# Patient Record
Sex: Male | Born: 1965 | Race: Black or African American | Hispanic: No | Marital: Single | Smoking: Never smoker
Health system: Southern US, Community
[De-identification: ages and names within clinical notes are randomized; demographics above are authoritative.]

## PROBLEM LIST (undated history)

## (undated) DIAGNOSIS — I10 Essential (primary) hypertension: Secondary | ICD-10-CM

---

## 2005-04-10 ENCOUNTER — Emergency Department: Payer: Self-pay | Admitting: Emergency Medicine

## 2005-04-10 ENCOUNTER — Other Ambulatory Visit: Payer: Self-pay

## 2009-06-07 ENCOUNTER — Ambulatory Visit: Payer: Self-pay | Admitting: Pain Medicine

## 2009-06-13 ENCOUNTER — Ambulatory Visit: Payer: Self-pay | Admitting: Pain Medicine

## 2018-11-14 ENCOUNTER — Other Ambulatory Visit: Payer: Self-pay | Admitting: Family Medicine

## 2018-11-15 LAB — HGB A1C W/O EAG: Hgb A1c MFr Bld: 6 % — ABNORMAL HIGH (ref 4.8–5.6)

## 2019-06-22 ENCOUNTER — Other Ambulatory Visit: Payer: Self-pay

## 2019-06-22 ENCOUNTER — Emergency Department: Payer: Self-pay

## 2019-06-22 ENCOUNTER — Emergency Department
Admission: EM | Admit: 2019-06-22 | Discharge: 2019-06-23 | Disposition: A | Discharge status: Home / Self Care | Payer: Self-pay | Attending: Emergency Medicine | Admitting: Emergency Medicine

## 2019-06-22 DIAGNOSIS — R202 Paresthesia of skin: Secondary | ICD-10-CM | POA: Insufficient documentation

## 2019-06-22 DIAGNOSIS — R030 Elevated blood-pressure reading, without diagnosis of hypertension: Secondary | ICD-10-CM | POA: Insufficient documentation

## 2019-06-22 LAB — APTT: aPTT: 33 seconds (ref 24–36)

## 2019-06-22 LAB — COMPREHENSIVE METABOLIC PANEL
ALT: 26 U/L (ref 0–44)
AST: 23 U/L (ref 15–41)
Albumin: 4.6 g/dL (ref 3.5–5.0)
Alkaline Phosphatase: 57 U/L (ref 38–126)
Anion gap: 9 (ref 5–15)
BUN: 19 mg/dL (ref 6–20)
CO2: 24 mmol/L (ref 22–32)
Calcium: 9.8 mg/dL (ref 8.9–10.3)
Chloride: 107 mmol/L (ref 98–111)
Creatinine, Ser: 1.13 mg/dL (ref 0.61–1.24)
GFR calc Af Amer: >60 mL/min (ref >60)
GFR calc non Af Amer: >60 mL/min (ref >60)
Glucose, Bld: 111 mg/dL — ABNORMAL HIGH (ref 70–99)
Potassium: 4.2 mmol/L (ref 3.5–5.1)
Sodium: 140 mmol/L (ref 135–145)
Total Bilirubin: 0.8 mg/dL (ref 0.3–1.2)
Total Protein: 8.2 g/dL — ABNORMAL HIGH (ref 6.5–8.1)

## 2019-06-22 LAB — CBC
HCT: 46.1 % (ref 39.0–52.0)
Hemoglobin: 16.1 g/dL (ref 13.0–17.0)
MCH: 29.8 pg (ref 26.0–34.0)
MCHC: 34.9 g/dL (ref 30.0–36.0)
MCV: 85.4 fL (ref 80.0–100.0)
Platelets: 257 10*3/uL (ref 150–400)
RBC: 5.4 MIL/uL (ref 4.22–5.81)
RDW: 12.6 % (ref 11.5–15.5)
WBC: 6.7 10*3/uL (ref 4.0–10.5)
nRBC: 0 % (ref 0.0–0.2)

## 2019-06-22 LAB — DIFFERENTIAL
Abs Immature Granulocytes: 0.03 10*3/uL (ref 0.00–0.07)
Basophils Absolute: 0.1 10*3/uL (ref 0.0–0.1)
Basophils Relative: 1 %
Eosinophils Absolute: 0.3 10*3/uL (ref 0.0–0.5)
Eosinophils Relative: 4 %
Immature Granulocytes: 0 %
Lymphocytes Relative: 33 %
Lymphs Abs: 2.2 10*3/uL (ref 0.7–4.0)
Monocytes Absolute: 0.6 10*3/uL (ref 0.1–1.0)
Monocytes Relative: 9 %
Neutro Abs: 3.6 10*3/uL (ref 1.7–7.7)
Neutrophils Relative %: 53 %

## 2019-06-22 LAB — PROTIME-INR
INR: 1 (ref 0.8–1.2)
Prothrombin Time: 13.1 seconds (ref 11.4–15.2)

## 2019-06-22 MED ORDER — SODIUM CHLORIDE 0.9% FLUSH: 3.0000 mL | Freq: Once | INTRAVENOUS | Status: DC

## 2019-06-22 NOTE — ED Triage Notes (Addendum)
Pt comes via POV from home with c/o left arm numbness. Pt states this started last night around 10:30 pm and radiated up his arm. Pt states today he is only having some tingling in his left fingers.  Pt denies any SOB, CP, headache or blurry vision. Pt is VAN negative and mini neuro negative.

## 2019-06-22 NOTE — ED Provider Notes (Signed)
[facility] Emergency Department Provider Note   ____________________________________________   First MD Initiated Contact with Patient 06/22/19 2341     (approximate)  I have reviewed the triage vital signs and the nursing notes.   HISTORY  Chief Complaint Numbness    HPI Jeremy Craig is a 53 y.o. male with no significant past medical history who presents to the ED complaining of numbness.  Patient reports onset of numbness in his left hand and arm since about 1030 yesterday evening.  He states that initially started in his fourth and fifth digits, then seem to migrate into his first and second digits with some radiation into his forearm.  He denies any numbness in his more proximal arm and has not had any weakness in his hand, wrist, elbow, or shoulder.  He denies any symptoms in his left lower extremity and has not had any vision changes or speech changes.  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  Symptoms have improved since onset.        History reviewed. No pertinent past medical history.  There are no active problems to display for this patient.   History reviewed. No pertinent surgical history.  Prior to Admission medications   Not on File    Allergies Patient has no allergy information on record.  No family history on file.  Social History Social History   Tobacco Use  . Smoking status: Not on file  Substance Use Topics  . Alcohol use: Not on file  . Drug use: Not on file    Review of Systems  Constitutional: No fever/chills Eyes: No visual changes. ENT: No sore throat. Cardiovascular: Denies chest pain. Respiratory: Denies shortness of breath. Gastrointestinal: No abdominal pain.  No nausea, no vomiting.  No diarrhea.  No constipation. Genitourinary: Negative for dysuria. Musculoskeletal: Negative for back pain. Skin: Negative for rash. Neurological:  Negative for headaches, focal weakness.  Positive for numbness.  ____________________________________________   PHYSICAL EXAM:  VITAL SIGNS: ED Triage Vitals  Enc Vitals Group     BP 06/22/19 1733 (!) 175/102     Pulse Rate 06/22/19 1733 89     Resp 06/22/19 1733 18     Temp 06/22/19 1733 98.2 F (36.8 C)     Temp Source 06/22/19 1733 Oral     SpO2 06/22/19 1733 96 %     Weight --      Height --      Head Circumference --      Peak Flow --      Pain Score 06/22/19 1740 0     Pain Loc --      Pain Edu? --      Excl. in GC? --     Constitutional: Alert and oriented. Eyes: Conjunctivae are normal. Head: Atraumatic. Nose: No congestion/rhinnorhea. Mouth/Throat: Mucous membranes are moist. Neck: Normal ROM Cardiovascular: Normal rate, regular rhythm. Grossly normal heart sounds. Respiratory: Normal respiratory effort.  No retractions. Lungs CTAB. Gastrointestinal: Soft and nontender. No distention. Genitourinary: deferred Musculoskeletal: No lower extremity tenderness nor edema. Neurologic:  Normal speech and language. No gross focal neurologic deficits are appreciated.  5 out of 5 strength in bilateral upper and lower extremities, sensation intact throughout. Skin:  Skin is warm, dry and intact. No rash noted. Psychiatric: Mood and affect are normal. Speech and behavior are normal.  ____________________________________________   LABS (all labs ordered are listed, but only abnormal  results are displayed)  Labs Reviewed  COMPREHENSIVE METABOLIC PANEL - Abnormal; Notable for the following components:      Result Value   Glucose, Bld 111 (*)    Total Protein 8.2 (*)    All other components within normal limits  PROTIME-INR  APTT  CBC  DIFFERENTIAL  I-STAT CREATININE, ED  CBG MONITORING, ED     PROCEDURES  Procedure(s) performed (including Critical Care):  Procedures   ____________________________________________   INITIAL IMPRESSION / ASSESSMENT AND  PLAN / ED COURSE       53 year old male presents to the ED complaining of numbness migrating through the digits of his left hand starting approximately 24 hours prior.  He has no focal neurologic deficits on my exam and states numbness has improved, no weakness noted.  Do not suspect acute stroke given his benign neurologic exam.  Also have low suspicion for TIA given the distribution of his symptoms, peripheral neuropathy seems much more likely.  His labs are unremarkable.  He does have an elevated blood pressure, however there is no evidence of hypertensive emergency.  Will refer patient to primary care for further evaluation of his blood pressure, counseled to return to the ED for new or worsening symptoms.  Patient agrees with plan.      ____________________________________________   FINAL CLINICAL IMPRESSION(S) / ED DIAGNOSES  Final diagnoses:  Paresthesia  Elevated BP without diagnosis of hypertension     ED Discharge Orders    None       Note:  This document was prepared using Dragon voice recognition software and may include unintentional dictation errors.   Blake Divine, MD 06/23/19 [phone]

## 2019-12-15 ENCOUNTER — Emergency Department
Admission: EM | Admit: 2019-12-15 | Discharge: 2019-12-15 | Disposition: A | Discharge status: Home / Self Care | Payer: BC Managed Care – PPO | Attending: Emergency Medicine | Admitting: Emergency Medicine

## 2019-12-15 ENCOUNTER — Encounter: Payer: Self-pay | Admitting: Emergency Medicine

## 2019-12-15 ENCOUNTER — Other Ambulatory Visit: Payer: Self-pay

## 2019-12-15 ENCOUNTER — Emergency Department: Payer: BC Managed Care – PPO

## 2019-12-15 DIAGNOSIS — R410 Disorientation, unspecified: Secondary | ICD-10-CM | POA: Diagnosis not present

## 2019-12-15 DIAGNOSIS — I10 Essential (primary) hypertension: Secondary | ICD-10-CM | POA: Insufficient documentation

## 2019-12-15 DIAGNOSIS — M791 Myalgia, unspecified site: Secondary | ICD-10-CM | POA: Diagnosis present

## 2019-12-15 LAB — URINALYSIS, COMPLETE (UACMP) WITH MICROSCOPIC
Bacteria, UA: NONE SEEN
Bilirubin Urine: NEGATIVE
Glucose, UA: NEGATIVE mg/dL
Hgb urine dipstick: NEGATIVE
Ketones, ur: NEGATIVE mg/dL
Leukocytes,Ua: NEGATIVE
Nitrite: NEGATIVE
Protein, ur: 100 mg/dL — AB
Specific Gravity, Urine: 1.01 (ref 1.005–1.030)
Squamous Epithelial / HPF: NONE SEEN (ref 0–5)
pH: 6 (ref 5.0–8.0)

## 2019-12-15 LAB — BASIC METABOLIC PANEL
Anion gap: 7 (ref 5–15)
BUN: 16 mg/dL (ref 6–20)
CO2: 25 mmol/L (ref 22–32)
Calcium: 9.5 mg/dL (ref 8.9–10.3)
Chloride: 104 mmol/L (ref 98–111)
Creatinine, Ser: 1.13 mg/dL (ref 0.61–1.24)
GFR calc Af Amer: >60 mL/min (ref >60)
GFR calc non Af Amer: >60 mL/min (ref >60)
Glucose, Bld: 119 mg/dL — ABNORMAL HIGH (ref 70–99)
Potassium: 4.5 mmol/L (ref 3.5–5.1)
Sodium: 136 mmol/L (ref 135–145)

## 2019-12-15 LAB — CBC
HCT: 47.3 % (ref 39.0–52.0)
Hemoglobin: 16.4 g/dL (ref 13.0–17.0)
MCH: 29.8 pg (ref 26.0–34.0)
MCHC: 34.7 g/dL (ref 30.0–36.0)
MCV: 86 fL (ref 80.0–100.0)
Platelets: 280 10*3/uL (ref 150–400)
RBC: 5.5 MIL/uL (ref 4.22–5.81)
RDW: 12.6 % (ref 11.5–15.5)
WBC: 5.4 10*3/uL (ref 4.0–10.5)
nRBC: 0 % (ref 0.0–0.2)

## 2019-12-15 LAB — TROPONIN I (HIGH SENSITIVITY): Troponin I (High Sensitivity): 7 ng/L (ref <18)

## 2019-12-15 MED ORDER — LABETALOL HCL 5 MG/ML IV SOLN
20.0000 mg | Freq: Once | INTRAVENOUS | Status: AC
  Administered 2019-12-15: 20 mg via INTRAVENOUS
  Filled 2019-12-15: qty 4

## 2019-12-15 MED ORDER — CARVEDILOL 6.25 MG PO TABS: 6.2500 mg | ORAL_TABLET | Freq: Two times a day (BID) | ORAL | 11 refills | Status: DC

## 2019-12-15 MED ORDER — LABETALOL HCL 5 MG/ML IV SOLN
40.0000 mg | Freq: Once | INTRAVENOUS | Status: AC
  Administered 2019-12-15: 17:00:00 40 mg via INTRAVENOUS

## 2019-12-15 MED ORDER — LABETALOL HCL 5 MG/ML IV SOLN
INTRAVENOUS | Status: AC
  Filled 2019-12-15: qty 4

## 2019-12-15 MED ORDER — ASPIRIN 81 MG PO CHEW
324.0000 mg | CHEWABLE_TABLET | Freq: Once | ORAL | Status: AC
  Administered 2019-12-15: 324 mg via ORAL
  Filled 2019-12-15: qty 4

## 2019-12-15 NOTE — Progress Notes (Signed)
eeg completed ° °

## 2019-12-15 NOTE — Procedures (Signed)
Patient Name: DOYT CASTELLANA  MRN: [id number]  Epilepsy Attending: Charlsie Quest  Referring Physician/Provider: DR Pauletta Browns Date: 12/15/2019  Duration: 25.36 mins  Patient history: 54yo M with episodes of transient speech disturbance. EEG to evaluate for seizure.   Level of alertness: awake,  asleep  AEDs during EEG study: None  Technical aspects: This EEG study was done with scalp electrodes positioned according to the 10-20 International system of electrode placement. Electrical activity was acquired at a sampling rate of 500Hz  and reviewed with a high frequency filter of 70Hz  and a low frequency filter of 1Hz . EEG data were recorded continuously and digitally stored.   DESCRIPTION: The posterior dominant rhythm consists of 9-10 Hz activity of moderate voltage (25-35 uV) seen predominantly in posterior head regions, symmetric and reactive to eye opening and eye closing. Sleep was characterized by vertex waves, sleep spindles (12-14hz ), maximal frontocentral region. Physiologic photic driving was seen during photic stimulation.  Hyperventilation was not performed.  IMPRESSION: This study is within normal limits. No seizures or epileptiform discharges were seen throughout the recording.  Deniesha Stenglein 

## 2019-12-15 NOTE — ED Triage Notes (Addendum)
Patient presents to the ED with hypertension, body aches, and occasional confusion since getting his 2nd covid shot on Saturday.  Patient states he is a very healthy person who walks multiple miles a day and he has been feeling very "weird and bad".  Patient states, "my family and my coworkers tell me I'm not comprehending like I normally do."  Patient is alert and oriented x 4 at this time.  Answering all questions appropriately.  Patient denies dizziness and blurry vision.

## 2019-12-15 NOTE — Consult Note (Signed)
Reason for Consult: periods of confusion  Requesting Physician: Dr. Jimmye Norman  CC: periods of confusion   HPI: Jeremy Craig is an 54 y.o. male no reported past medical history and not on any medications comes in with hypertension, body aches, occasional confusion since getting post second Covid shot on Saturday.  Patient states that he has had periods of time where he could not comprehend what others were telling him.  Last episode yesterday. Unable to tell me duration. He also could not remember the conversation he was in.  Currently back to baseline  History reviewed. No pertinent past medical history.  History reviewed. No pertinent surgical history.  No family history on file.  Social History:  reports that he has never smoked. He has never used smokeless tobacco. He reports that he does not drink alcohol or use drugs.  No Known Allergies  Medications: I have reviewed the patient's current medications.  ROS: History obtained from the patient  General ROS: negative for - chills, fatigue, fever, night sweats, weight gain or weight loss Psychological ROS: negative for - behavioral disorder, hallucinations, memory difficulties, mood swings or suicidal ideation Ophthalmic ROS: negative for - blurry vision, double vision, eye pain or loss of vision ENT ROS: negative for - epistaxis, nasal discharge, oral lesions, sore throat, tinnitus or vertigo Allergy and Immunology ROS: negative for - hives or itchy/watery eyes Hematological and Lymphatic ROS: negative for - bleeding problems, bruising or swollen lymph nodes Endocrine ROS: negative for - galactorrhea, hair pattern changes, polydipsia/polyuria or temperature intolerance Respiratory ROS: negative for - cough, hemoptysis, shortness of breath or wheezing Cardiovascular ROS: negative for - chest pain, dyspnea on exertion, edema or irregular heartbeat Gastrointestinal ROS: negative for - abdominal pain, diarrhea, hematemesis,  nausea/vomiting or stool incontinence Genito-Urinary ROS: negative for - dysuria, hematuria, incontinence or urinary frequency/urgency Musculoskeletal ROS: negative for - joint swelling or muscular weakness Neurological ROS: as noted in HPI Dermatological ROS: negative for rash and skin lesion changes  Physical Examination: Blood pressure (!) 188/132, pulse 75, temperature 97.8 F (36.6 C), temperature source Oral, resp. rate 16, height 5\' 10"  (1.778 m), weight 113.4 kg, SpO2 97 %.    Neurological Examination   Mental Status: Alert, oriented, thought content appropriate.  Speech fluent without evidence of aphasia.  Able to follow 3 step commands without difficulty. Cranial Nerves: II: Discs flat bilaterally; Visual fields grossly normal, pupils equal, round, reactive to light and accommodation III,IV, VI: ptosis not present, extra-ocular motions intact bilaterally V,VII: smile symmetric, facial light touch sensation normal bilaterally VIII: hearing normal bilaterally IX,X: gag reflex present XI: bilateral shoulder shrug XII: midline tongue extension Motor: Right : Upper extremity   5/5    Left:     Upper extremity   5/5  Lower extremity   5/5     Lower extremity   5/5 Tone and bulk:normal tone throughout; no atrophy noted Sensory: Pinprick and light touch intact throughout, bilaterally Deep Tendon Reflexes: 2+ and symmetric throughout Plantars: Right: downgoing   Left: downgoing Cerebellar: normal finger-to-nose, normal rapid alternating movements and normal heel-to-shin test Gait: not tested      Laboratory Studies:   Basic Metabolic Panel: Recent Labs  Lab 12/15/19 0951  NA 136  K 4.5  CL 104  CO2 25  GLUCOSE 119*  BUN 16  CREATININE 1.13  CALCIUM 9.5    Liver Function Tests: No results for input(s): AST, ALT, ALKPHOS, BILITOT, PROT, ALBUMIN in the last 168 hours. No results for input(s):  LIPASE, AMYLASE in the last 168 hours. No results for input(s): AMMONIA  in the last 168 hours.  CBC: Recent Labs  Lab 12/15/19 0951  WBC 5.4  HGB 16.4  HCT 47.3  MCV 86.0  PLT 280    Cardiac Enzymes: No results for input(s): CKTOTAL, CKMB, CKMBINDEX, TROPONINI in the last 168 hours.  BNP: Invalid input(s): POCBNP  CBG: No results for input(s): GLUCAP in the last 168 hours.  Microbiology: No results found for this or any previous visit.  Coagulation Studies: No results for input(s): LABPROT, INR in the last 72 hours.  Urinalysis:  Recent Labs  Lab 12/15/19 0951  COLORURINE YELLOW*  LABSPEC 1.010  PHURINE 6.0  GLUCOSEU NEGATIVE  HGBUR NEGATIVE  BILIRUBINUR NEGATIVE  KETONESUR NEGATIVE  PROTEINUR 100*  NITRITE NEGATIVE  LEUKOCYTESUR NEGATIVE    Lipid Panel:  No results found for: CHOL, TRIG, HDL, CHOLHDL, VLDL, LDLCALC  HgbA1C:  Lab Results  Component Value Date   HGBA1C 6.0 (H) 11/14/2018    Urine Drug Screen:  No results found for: LABOPIA, COCAINSCRNUR, LABBENZ, AMPHETMU, THCU, LABBARB  Alcohol Level: No results for input(s): ETH in the last 168 hours.  Other results: EKG: normal EKG, normal sinus rhythm, unchanged from previous tracings.  Imaging: CT Head Wo Contrast  Result Date: 12/15/2019 CLINICAL DATA:  Delirium, has been feeling weird and bad, hypertension, has been having body aches and occasional confusion since getting second COVID shot Saturday EXAM: CT HEAD WITHOUT CONTRAST TECHNIQUE: Contiguous axial images were obtained from the base of the skull through the vertex without intravenous contrast. Sagittal and coronal MPR images reconstructed from axial data set. COMPARISON:  06/22/2019 FINDINGS: Brain: Normal ventricular morphology. No midline shift or mass effect. Normal appearance of brain parenchyma. No intracranial hemorrhage, mass lesion, evidence of acute infarction, or extra-axial fluid collection. Vascular: No hyperdense vessels Skull: Intact Sinuses/Orbits: Clear Other: N/A IMPRESSION: Normal exam,  unchanged. Electronically Signed   By: Ulyses Southward M.D.   On: 12/15/2019 14:15     Assessment/Plan:  hypertension, body aches, occasional confusion since getting a second Covid shot on Saturday.  Patient states he is a healthy person has never had any medical complaints.  Recently he has had periods of time where he could not comprehend what others were telling him.  This has resolved at this time.  - Unclear etiology if this could be post covid reaction  - BP control and receiving labetalol now - CTH no acute abnormalities - EEG will be done and can likely be d/c after that.  12/15/2019, 2:54 PM

## 2019-12-15 NOTE — ED Provider Notes (Signed)
-----------------------------------------   3:04 PM on 12/15/2019 -----------------------------------------  Blood pressure (!) 188/132, pulse 75, temperature 97.8 F (36.6 C), temperature source Oral, resp. rate 16, height 5\' 10"  (1.778 m), weight 113.4 kg, SpO2 97 %.  Assuming care from Dr. .  In short, Jeremy Craig is a 54 y.o. male with a chief complaint of Generalized Body Aches .  Refer to the original H&P for additional details.  The current plan of care is to follow-up repeat blood pressure.  ----------------------------------------- 6:01 PM on 12/15/2019 -----------------------------------------  Patient returned from EEG and remains asymptomatic at this time.  Unfortunately, his blood pressure remained significantly elevated and he was given 40 mg of IV labetalol.  Subsequently, his current pressure has gradually improved.  I suspect it is chronically elevated and will require gradual control, patient to be started on p.o. medications per Dr. 12/17/2019 and he was advised to follow-up with PCP.  Patient agrees with plan.    Mayford Knife, MD 12/15/19 [phone]

## 2019-12-15 NOTE — ED Notes (Signed)
Pt states coming in after getting his 2 covid swabs, and states he just did not feel well. Pt on bed and is able to move all extremities. NAD noted. Pt on pulse ox and bp monitoring.

## 2019-12-15 NOTE — ED Provider Notes (Signed)
[facility] Emergency Department Provider Note       Time seen: ----------------------------------------- 1:29 PM on 12/15/2019 -----------------------------------------   I have reviewed the triage vital signs and the nursing notes.  HISTORY   Chief Complaint Generalized Body Aches   HPI Jeremy Craig is a 54 y.o. male with no significant past medical history who presents to the ED for hypertension, body aches, occasional confusion since getting a second Covid shot on Saturday.  Patient states he is a healthy person has never had any medical complaints.  Recently he has had periods of time where he could not comprehend what others were telling him.  This has resolved at this time.  History reviewed. No pertinent past medical history.  There are no problems to display for this patient.   History reviewed. No pertinent surgical history.  Allergies Patient has no known allergies.  Social History Social History   Tobacco Use  . Smoking status: Never Smoker  . Smokeless tobacco: Never Used  Substance Use Topics  . Alcohol use: Never  . Drug use: Never    Review of Systems Constitutional: Negative for fever. Cardiovascular: Negative for chest pain. Respiratory: Negative for shortness of breath. Gastrointestinal: Negative for abdominal pain, vomiting and diarrhea. Musculoskeletal: Negative for back pain. Skin: Negative for rash. Neurological: Negative for headaches, focal weakness or numbness.  Positive for confusion  All systems negative/normal/unremarkable except as stated in the HPI  ____________________________________________   PHYSICAL EXAM:  VITAL SIGNS: ED Triage Vitals  Enc Vitals Group     BP 12/15/19 0939 (!) 182/120     Pulse Rate 12/15/19 0939 84     Resp 12/15/19 0939 18     Temp 12/15/19 0939 97.8 F (36.6 C)     Temp Source 12/15/19 0939 Oral     SpO2 12/15/19 0939 98 %     Weight 12/15/19 0940 250 lb (113.4 kg)      Height 12/15/19 0940 5\' 10"  (1.778 m)     Head Circumference --      Peak Flow --      Pain Score 12/15/19 0940 2     Pain Loc --      Pain Edu? --      Excl. in GC? --     Constitutional: Alert and oriented. Well appearing and in no distress. Eyes: Conjunctivae are normal. Normal extraocular movements. ENT      Head: Normocephalic and atraumatic.      Nose: No congestion/rhinnorhea.      Mouth/Throat: Mucous membranes are moist.      Neck: No stridor. Cardiovascular: Normal rate, regular rhythm. No murmurs, rubs, or gallops. Respiratory: Normal respiratory effort without tachypnea nor retractions. Breath sounds are clear and equal bilaterally. No wheezes/rales/rhonchi. Gastrointestinal: Soft and nontender. Normal bowel sounds Musculoskeletal: Nontender with normal range of motion in extremities. No lower extremity tenderness nor edema. Neurologic:  Normal speech and language. No gross focal neurologic deficits are appreciated.  Strength, sensation, cranial nerves are normal Skin:  Skin is warm, dry and intact. No rash noted. Psychiatric: Mood and affect are normal. Speech and behavior are normal.  ____________________________________________  EKG: Interpreted by me.  Sinus rhythm with rate of 85 bpm, T wave abnormality, normal axis, normal QT  ____________________________________________  ED COURSE:  As part of my medical decision making, I reviewed the following data within the electronic MEDICAL RECORD NUMBER History obtained from family if available, nursing notes, old chart and ekg, as well as notes  from prior ED visits. Patient presented for confusion, we will assess with labs and imaging as indicated at this time.   Procedures  Jeremy Craig was evaluated in Emergency Department on 12/15/2019 for the symptoms described in the history of present illness. He was evaluated in the context of the global COVID-19 pandemic, which necessitated consideration that the patient  might be at risk for infection with the SARS-CoV-2 virus that causes COVID-19. Institutional protocols and algorithms that pertain to the evaluation of patients at risk for COVID-19 are in a state of rapid change based on information released by regulatory bodies including the CDC and federal and state organizations. These policies and algorithms were followed during the patient's care in the ED.  ____________________________________________   LABS (pertinent positives/negatives)  Labs Reviewed  BASIC METABOLIC PANEL - Abnormal; Notable for the following components:      Result Value   Glucose, Bld 119 (*)    All other components within normal limits  URINALYSIS, COMPLETE (UACMP) WITH MICROSCOPIC - Abnormal; Notable for the following components:   Color, Urine YELLOW (*)    APPearance CLEAR (*)    Protein, ur 100 (*)    All other components within normal limits  CBC  TROPONIN I (HIGH SENSITIVITY)    RADIOLOGY Images were viewed by me  CT head  IMPRESSION:  Normal exam, unchanged.  ____________________________________________   DIFFERENTIAL DIAGNOSIS   CVA, TIA, vaccine reaction, dehydration, electrolyte abnormality, hypertensive urgency  FINAL ASSESSMENT AND PLAN  Confusion, hypertensive urgency   Plan: The patient had presented for periods of confusion after the latest COVID-19 vaccination. Patient's labs were unremarkable. Patient's imaging did not reveal any acute process.  I have discussed with neurology and he will evaluate the patient while in the ER.  We will obtain a stat EEG.  Patient will also received IV labetalol for blood pressure control.  Clinically he is expressing signs of receptive aphasia that have been periodic for the last several days.   Jeremy Aly, MD    Note: This note was generated in part or whole with voice recognition software. Voice recognition is usually quite accurate but there are transcription errors that can and very often do  occur. I apologize for any typographical errors that were not detected and corrected.     Jeremy Newport, MD 12/15/19 1447

## 2019-12-16 ENCOUNTER — Other Ambulatory Visit: Payer: Self-pay

## 2019-12-16 ENCOUNTER — Emergency Department
Admission: EM | Admit: 2019-12-16 | Discharge: 2019-12-16 | Disposition: A | Discharge status: Home / Self Care | Payer: BC Managed Care – PPO | Attending: Emergency Medicine | Admitting: Emergency Medicine

## 2019-12-16 ENCOUNTER — Encounter: Payer: Self-pay | Admitting: Emergency Medicine

## 2019-12-16 DIAGNOSIS — I10 Essential (primary) hypertension: Secondary | ICD-10-CM

## 2019-12-16 DIAGNOSIS — Z760 Encounter for issue of repeat prescription: Secondary | ICD-10-CM | POA: Diagnosis present

## 2019-12-16 HISTORY — DX: Essential (primary) hypertension: I10

## 2019-12-16 MED ORDER — ACETAMINOPHEN 500 MG PO TABS
ORAL_TABLET | ORAL | Status: AC
  Filled 2019-12-16: qty 2

## 2019-12-16 MED ORDER — CARVEDILOL 6.25 MG PO TABS: 6.2500 mg | ORAL_TABLET | Freq: Two times a day (BID) | ORAL | 1 refills | Status: AC

## 2019-12-16 MED ORDER — CARVEDILOL 6.25 MG PO TABS
6.2500 mg | ORAL_TABLET | Freq: Once | ORAL | Status: AC
  Administered 2019-12-16: 05:00:00 6.25 mg via ORAL
  Filled 2019-12-16: qty 1

## 2019-12-16 MED ORDER — ACETAMINOPHEN 500 MG PO TABS
1000.0000 mg | ORAL_TABLET | Freq: Once | ORAL | Status: AC
  Administered 2019-12-16: 02:00:00 1000 mg via ORAL

## 2019-12-16 NOTE — ED Provider Notes (Signed)
[facility] Emergency Department Provider Note  ____________________________________________  Time seen: Approximately 4:42 AM  I have reviewed the triage vital signs and the nursing notes.   HISTORY  Chief Complaint Medication Refill   HPI Jeremy Craig is a 54 y.o. male patient who was seen yesterday for side effects associated with his Covid shot and diagnosed with elevated BP who presents requesting a new prescription for the medication.  Patient reports that he felt very bad for several days after the first shot.  He received the second one 3 days ago.    Since then, he has had headaches, elevated blood pressure, body aches.  He had a pretty extensive evaluation yesterday and was started on coreg for HTN. Unfortunately, when patient was discharged he did not received a prescription for it. He went home and slept. When he woke up he noticed he did not have a prescription and therefore returned to the ER for a prescription. He denies any new symptoms.   Past Medical History:  Diagnosis Date  . Hypertension     Prior to Admission medications   Medication Sig Start Date End Date Taking? Authorizing Provider  carvedilol (COREG) 6.25 MG tablet Take 1 tablet (6.25 mg total) by mouth 2 (two) times daily. 12/16/19 12/15/20  Nita Sickle, MD    Allergies Patient has no known allergies.  No family history on file.  Social History Social History   Tobacco Use  . Smoking status: Never Smoker  . Smokeless tobacco: Never Used  Substance Use Topics  . Alcohol use: Never  . Drug use: Never    Review of Systems  Constitutional: Negative for fever. Eyes: Negative for visual changes. ENT: Negative for sore throat. Neck: No neck pain  Cardiovascular: Negative for chest pain. Respiratory: Negative for shortness of breath. Gastrointestinal: Negative for abdominal pain, vomiting or diarrhea. Genitourinary: Negative for dysuria. Musculoskeletal:  Negative for back pain. Skin: Negative for rash. Neurological: Negative for weakness or numbness. + HA Psych: No SI or HI  ____________________________________________   PHYSICAL EXAM:  VITAL SIGNS: ED Triage Vitals [12/16/19 0045]  Enc Vitals Group     BP (!) 176/96     Pulse Rate 80     Resp 18     Temp 98.2 F (36.8 C)     Temp Source Oral     SpO2 96 %     Weight 250 lb (113.4 kg)     Height 5\' 10"  (1.778 m)     Head Circumference      Peak Flow      Pain Score 5     Pain Loc      Pain Edu?      Excl. in [place]?     Constitutional: Alert and oriented. Well appearing and in no apparent distress. HEENT:      Head: Normocephalic and atraumatic.         Eyes: Conjunctivae are normal. Sclera is non-icteric.       Mouth/Throat: Mucous membranes are moist.       Neck: Supple with no signs of meningismus. Cardiovascular: Regular rate and rhythm. No murmurs, gallops, or rubs. 2+ symmetrical distal pulses are present in all extremities.  Respiratory: Normal respiratory effort. Lungs are clear to auscultation bilaterally. No wheezes, crackles, or rhonchi.  Gastrointestinal: Soft, non tender Musculoskeletal: Nontender with normal range of motion in all extremities. No edema, cyanosis, or erythema of extremities. Neurologic: Normal speech and language. Face is symmetric.  Normal  gait.  intact strength and sensation x 4. Skin: Skin is warm, dry and intact. No rash noted. Psychiatric: Mood and affect are normal. Speech and behavior are normal.  ____________________________________________   LABS (all labs ordered are listed, but only abnormal results are displayed)  Labs Reviewed - No data to display ____________________________________________  EKG  none  ____________________________________________  RADIOLOGY  none  ____________________________________________   PROCEDURES  Procedure(s) performed: None Procedures Critical Care performed:   None ____________________________________________   INITIAL IMPRESSION / ASSESSMENT AND PLAN / ED COURSE   54 y.o. male patient who was seen yesterday for side effects associated with his Covid shot and diagnosed with elevated BP who presents requesting a new prescription for the medication.  Patient was given a dose of Coreg here and given a paper prescription.  He is otherwise well-appearing with a normal physical exam.  Recommended rest and hydration at home, Tylenol for body aches and headache.  No indication for further labs or any imaging since those were done less than 24 hours ago.  Discussed my standard return precautions and close follow-up with PCP.  Old medical records were reviewed.      _____________________________________________ Please note:  Patient was evaluated in Emergency Department today for the symptoms described in the history of present illness. Patient was evaluated in the context of the global COVID-19 pandemic, which necessitated consideration that the patient might be at risk for infection with the SARS-CoV-2 virus that causes COVID-19. Institutional protocols and algorithms that pertain to the evaluation of patients at risk for COVID-19 are in a state of rapid change based on information released by regulatory bodies including the CDC and federal and state organizations. These policies and algorithms were followed during the patient's care in the ED.  Some ED evaluations and interventions may be delayed as a result of limited staffing during the pandemic.   [place] Controlled Substance Database was reviewed by me. ____________________________________________   FINAL CLINICAL IMPRESSION(S) / ED DIAGNOSES   Final diagnoses:  Medication refill  Essential hypertension      NEW MEDICATIONS STARTED DURING THIS VISIT:  ED Discharge Orders         Ordered    carvedilol (COREG) 6.25 MG tablet  2 times daily     12/16/19 0440           Note:  This document was  prepared using Dragon voice recognition software and may include unintentional dictation errors.    Alfred Levins, [place], MD 12/16/19 [phone]

## 2019-12-16 NOTE — ED Triage Notes (Signed)
Pt presents to ED with c/o headache and elevated blood pressure. Pt states he was just discharged from this ED around 1800 and fell asleep  and never got his prescriptions filled. Pt reports when he checked the paper work there was no written prescription. States he feels like his pressure is elevated again. Denies dizziness, blurred vision or chest pain.

## 2019-12-18 ENCOUNTER — Other Ambulatory Visit: Payer: Self-pay

## 2019-12-18 ENCOUNTER — Encounter: Payer: Self-pay | Admitting: Emergency Medicine

## 2019-12-18 DIAGNOSIS — I10 Essential (primary) hypertension: Secondary | ICD-10-CM | POA: Diagnosis not present

## 2019-12-18 DIAGNOSIS — Z79899 Other long term (current) drug therapy: Secondary | ICD-10-CM | POA: Diagnosis not present

## 2019-12-18 DIAGNOSIS — G4700 Insomnia, unspecified: Secondary | ICD-10-CM | POA: Diagnosis not present

## 2019-12-18 DIAGNOSIS — R519 Headache, unspecified: Secondary | ICD-10-CM | POA: Diagnosis present

## 2019-12-18 NOTE — ED Triage Notes (Signed)
Patient state that he has had a headache times a week. Patient states that he has been seen here times three for the same. Patient states that he was started on carvedilol for his blood pressure.

## 2019-12-19 ENCOUNTER — Emergency Department
Admission: EM | Admit: 2019-12-19 | Discharge: 2019-12-19 | Disposition: A | Discharge status: Home / Self Care | Payer: BC Managed Care – PPO | Attending: Emergency Medicine | Admitting: Emergency Medicine

## 2019-12-19 DIAGNOSIS — R519 Headache, unspecified: Secondary | ICD-10-CM

## 2019-12-19 DIAGNOSIS — I10 Essential (primary) hypertension: Secondary | ICD-10-CM

## 2019-12-19 MED ORDER — TRAZODONE HCL 50 MG PO TABS
50.0000 mg | ORAL_TABLET | Freq: Once | ORAL | Status: AC
  Administered 2019-12-19: 50 mg via ORAL
  Filled 2019-12-19: qty 1

## 2019-12-19 MED ORDER — BUTALBITAL-APAP-CAFFEINE 50-325-40 MG PO TABS
1.0000 | ORAL_TABLET | Freq: Once | ORAL | Status: AC
  Administered 2019-12-19: 1 via ORAL
  Filled 2019-12-19: qty 1

## 2019-12-19 MED ORDER — BUTALBITAL-APAP-CAFFEINE 50-325-40 MG PO TABS: 1.0000 | ORAL_TABLET | ORAL | 0 refills | Status: AC | PRN

## 2019-12-19 MED ORDER — CLONIDINE HCL 0.1 MG PO TABS
0.1000 mg | ORAL_TABLET | Freq: Once | ORAL | Status: AC
  Administered 2019-12-19: 0.1 mg via ORAL
  Filled 2019-12-19: qty 1

## 2019-12-19 MED ORDER — CLONIDINE HCL 0.1 MG PO TABS: 0.1000 mg | ORAL_TABLET | Freq: Two times a day (BID) | ORAL | 0 refills | Status: AC | PRN

## 2019-12-19 NOTE — Discharge Instructions (Addendum)
1.  Continue to take your Coreg twice daily every day. 2.  You may take Fioricet as needed for headaches. 3.  You may take Clonidine tablet up to twice daily for: Top blood pressure number > 200 or Bottom blood pressure number > 110 4.  Return to the ER for worsening symptoms, persistent vomiting, difficulty breathing or other concerns.

## 2019-12-19 NOTE — ED Provider Notes (Signed)
[facility] Emergency Department Provider Note   ____________________________________________   First MD Initiated Contact with Patient 12/19/19 0153     (approximate)  I have reviewed the triage vital signs and the nursing notes.   HISTORY  Chief Complaint Headache    HPI Jeremy Craig is a 54 y.o. male returns to the ED from home with a chief complaint of headache.  Patient has had a headache all week.  Started after he received the second shot of his Pfizer Covid vaccine.  He was initially seen in the ED 12/15/2019 for headache, transient speech disturbance.  Had an unremarkable CT, neurology consult and unremarkable EEG performed.  He was given a dose of Coreg and discharged home.  He returned on 4/21 for prescription for Coreg which he did not receive on prior discharge.  Reports mild frontal headache without associated photophobia, neck pain, nausea/vomiting or dizziness.  Denies fever, chest pain, shortness of breath, abdominal pain, dysuria or diarrhea.  States he has had insomnia for years and is requesting something to help him sleep.       Past Medical History:  Diagnosis Date  . Hypertension     There are no problems to display for this patient.   History reviewed. No pertinent surgical history.  Prior to Admission medications   Medication Sig Start Date End Date Taking? Authorizing Provider  carvedilol (COREG) 6.25 MG tablet Take 1 tablet (6.25 mg total) by mouth 2 (two) times daily. 12/16/19 12/15/20  Rudene Re, MD    Allergies Patient has no known allergies.  No family history on file.  Social History Social History   Tobacco Use  . Smoking status: Never Smoker  . Smokeless tobacco: Never Used  Substance Use Topics  . Alcohol use: Never  . Drug use: Never    Review of Systems  Constitutional: Positive for insomnia.  No fever/chills Eyes: No visual changes. ENT: No sore throat. Cardiovascular: Denies chest  pain. Respiratory: Denies shortness of breath. Gastrointestinal: No abdominal pain.  No nausea, no vomiting.  No diarrhea.  No constipation. Genitourinary: Negative for dysuria. Musculoskeletal: Negative for back pain. Skin: Negative for rash. Neurological: Positive for headache. Negative for focal weakness or numbness.   ____________________________________________   PHYSICAL EXAM:  VITAL SIGNS: ED Triage Vitals [12/18/19 2254]  Enc Vitals Group     BP (!) 162/110     Pulse Rate 81     Resp 18     Temp 98.1 F (36.7 C)     Temp Source Oral     SpO2 97 %     Weight 250 lb (113.4 kg)     Height 5\' 10"  (1.778 m)     Head Circumference      Peak Flow      Pain Score 8     Pain Loc      Pain Edu?      Excl. in [place]?     Constitutional: Alert and oriented. Well appearing and in no acute distress. Eyes: Conjunctivae are normal. PERRL. EOMI. Head: Atraumatic. Nose: No congestion/rhinnorhea. Mouth/Throat: Mucous membranes are moist.   Neck: No stridor.  Supple neck without meningismus.  No carotid bruits. Cardiovascular: Normal rate, regular rhythm. Grossly normal heart sounds.  Good peripheral circulation. Respiratory: Normal respiratory effort.  No retractions. Lungs CTAB. Gastrointestinal: Soft and nontender. No distention. No abdominal bruits. No CVA tenderness. Musculoskeletal: No lower extremity tenderness nor edema.  No joint effusions. Neurologic: Alert and oriented x3.  CN II to XII grossly intact.  Normal speech and language. No gross focal neurologic deficits are appreciated. MAEx4. No gait instability. Skin:  Skin is warm, dry and intact. No rash noted.  No petechiae. Psychiatric: Mood and affect are normal. Speech and behavior are normal.  ____________________________________________   LABS (all labs ordered are listed, but only abnormal results are displayed)  Labs Reviewed - No data to  display ____________________________________________  EKG  None ____________________________________________  RADIOLOGY  ED MD interpretation: None  Official radiology report(s): No results found.  ____________________________________________   PROCEDURES  Procedure(s) performed (including Critical Care):  Procedures   ____________________________________________   INITIAL IMPRESSION / ASSESSMENT AND PLAN / ED COURSE  As part of my medical decision making, I reviewed the following data within the electronic MEDICAL RECORD NUMBER Nursing notes reviewed and incorporated, Old chart reviewed and Notes from prior ED visits     Jeremy Craig was evaluated in Emergency Department on 12/19/2019 for the symptoms described in the history of present illness. He was evaluated in the context of the global COVID-19 pandemic, which necessitated consideration that the patient might be at risk for infection with the SARS-CoV-2 virus that causes COVID-19. Institutional protocols and algorithms that pertain to the evaluation of patients at risk for COVID-19 are in a state of rapid change based on information released by regulatory bodies including the CDC and federal and state organizations. These policies and algorithms were followed during the patient's care in the ED.    54 year old male presenting with elevated blood pressure and headache since receiving Pfizer Covid vaccine 1 week ago. Differential diagnosis includes, but is not limited to, intracranial hemorrhage, meningitis/encephalitis, previous head trauma, cavernous venous thrombosis, tension headache, temporal arteritis, migraine or migraine equivalent, idiopathic intracranial hypertension, and non-specific headache.  I have personally reviewed patient's records and see that he had neurology evaluation plus EEG on his initial ED visit.  His diastolic blood pressure at that time was in the 130s.  This has improved on this visit.  He  returned on his second visit to get a prescription for Coreg.  He is taking Coreg 6.25 mg twice daily and just started this 3 days ago.  He is well-appearing without focal neurological deficits.  We discussed that it will take a couple of weeks for antihypertensive to be therapeutic in his system.  Will prescribe Fioricet for headaches in the meantime.  Will also prescribe limited quantity of clonidine with very strict instructions better control his blood pressure.  Will start low-dose trazodone for his complaint of insomnia.  On an earlier visit patient was referred to a local PCP and plans to make an appointment for follow-up.  Strict return precautions given.  Patient verbalizes understanding and agrees with plan of care.      ____________________________________________   FINAL CLINICAL IMPRESSION(S) / ED DIAGNOSES  Final diagnoses:  Essential hypertension  Acute nonintractable headache, unspecified headache type     ED Discharge Orders    None       Note:  This document was prepared using Dragon voice recognition software and may include unintentional dictation errors.   Irean Hong, MD 12/19/19 [phone]

## 2021-06-05 ENCOUNTER — Other Ambulatory Visit: Payer: Self-pay | Admitting: Urology

## 2021-06-06 LAB — PSA: Prostate Specific Ag, Serum: 0.7 ng/mL (ref 0.0–4.0)

## 2021-06-12 ENCOUNTER — Telehealth: Payer: Self-pay

## 2021-06-12 NOTE — Telephone Encounter (Signed)
Normal PSA results letter mailed.   June 12, 2021         Dear Mr. Jeremy Craig,   Thank you for your participation in the Prostate Cancer Screening at [facility] June 05, 2021.   The result of your blood test for the level of the Prostate Specific Antigen (PSA) was found to be normal. It is recommended that continue yearly screening and share these results with your physician.  If you have any questions about these results, please call Fonnie Mu at [phone] or Kayli Beal at [phone].  Sincerely,     Fonnie Mu, MSN, RN,OCN Oncology Outreach Manager [facility]   Clois Dupes, LPN Oncology Outreach  [facility]

## 2021-11-13 IMAGING — CT CT HEAD W/O CM
2 series · 13 of 41 positions shown, 16 images · non-contrast
Comparison: 06/22/2019

CLINICAL DATA: Delirium, has been feeling weird and bad,
hypertension, has been having body aches and occasional confusion
since getting second COVID shot [REDACTED]

EXAM:
CT HEAD WITHOUT CONTRAST
TECHNIQUE: Contiguous axial images were obtained from the base of the skull
through the vertex without intravenous contrast. Sagittal and
coronal MPR images reconstructed from axial data set.

[Series 4: coronal soft tissue · coronal · 0.31mm/px · 10 of 69 slices shown, 13 images]
[im 8/69  brain]
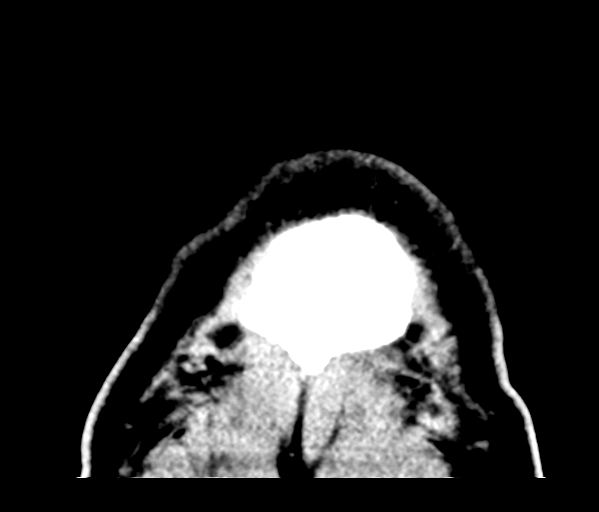
[im 8/69  bone]
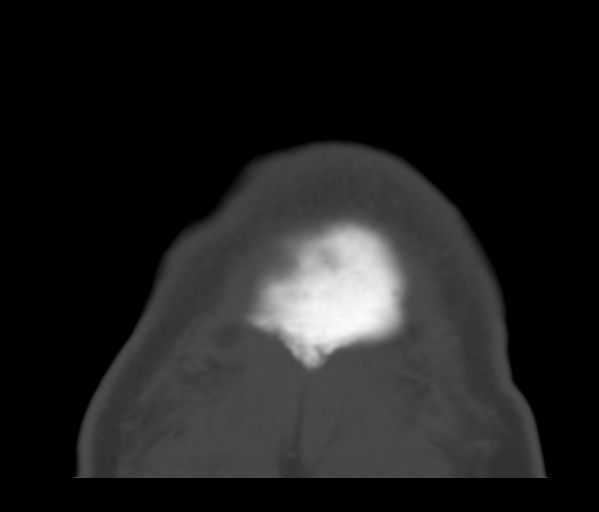
[im 14/69  brain]
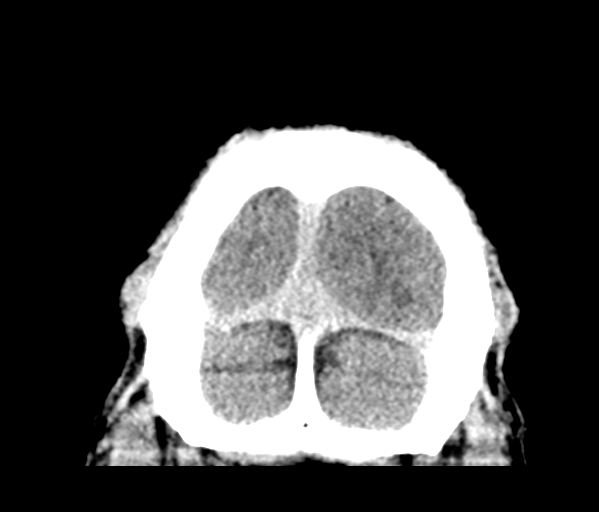
[im 19/69  brain]
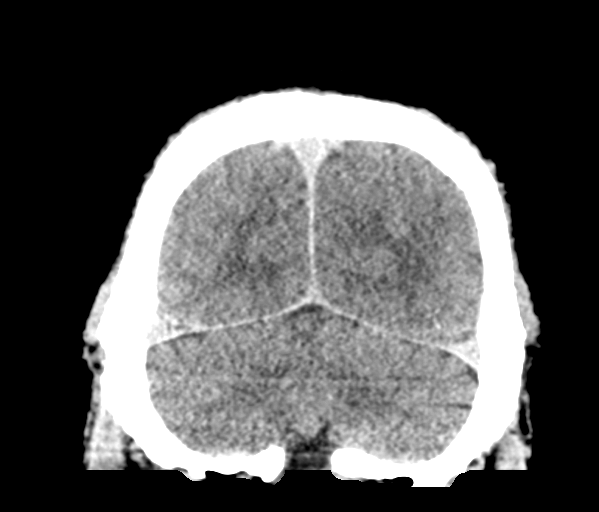
[im 28/69  brain]
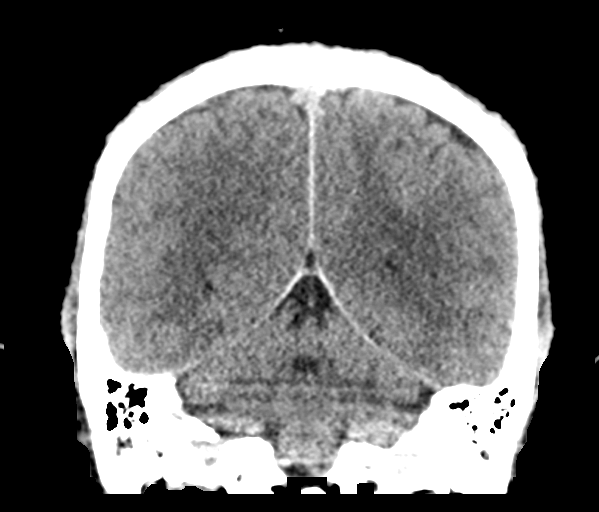
[im 32/69  brain]
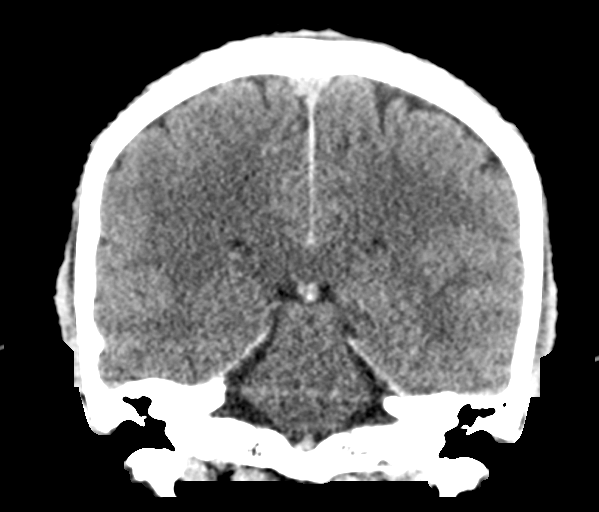
[im 32/69  bone]
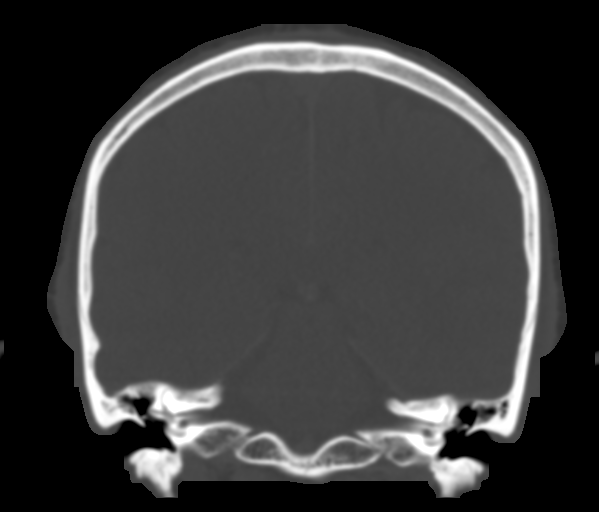
[im 38/69  brain]
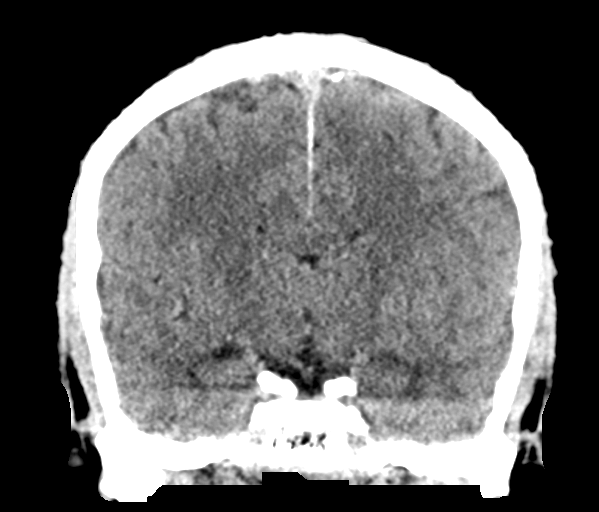
[im 46/69  brain]
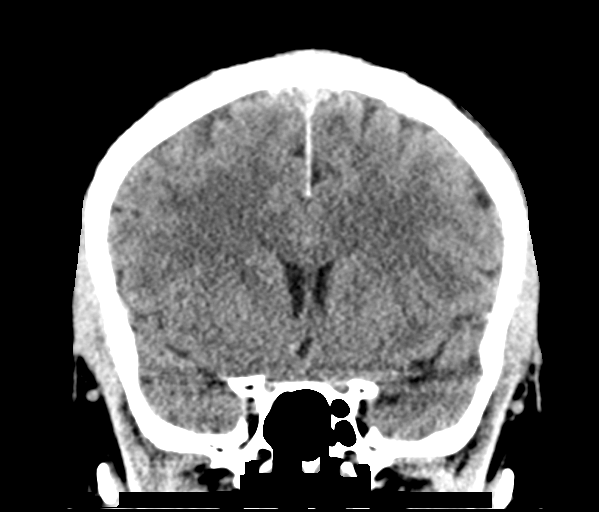
[im 53/69  brain]
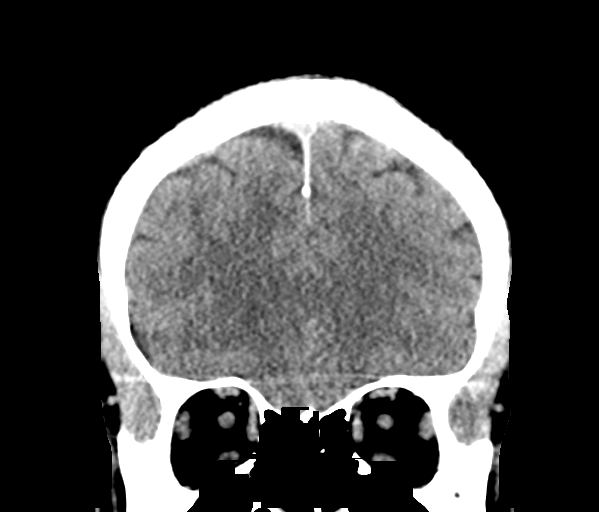
[im 59/69  brain]
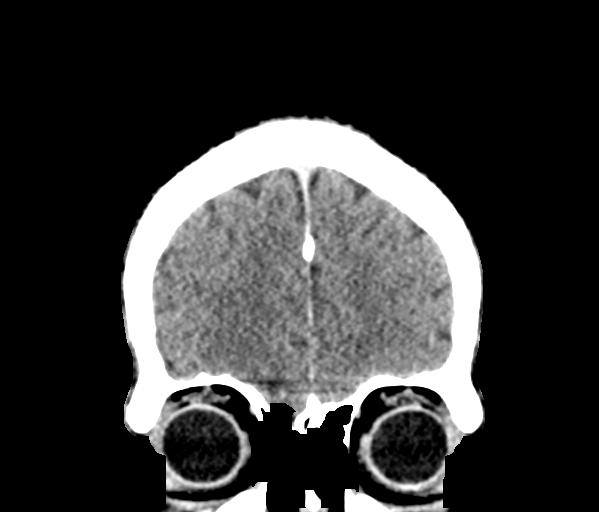
[im 59/69  bone]
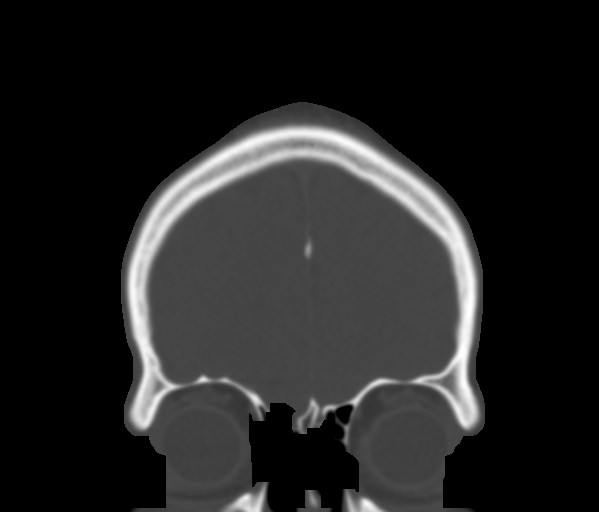
[im 64/69  brain]
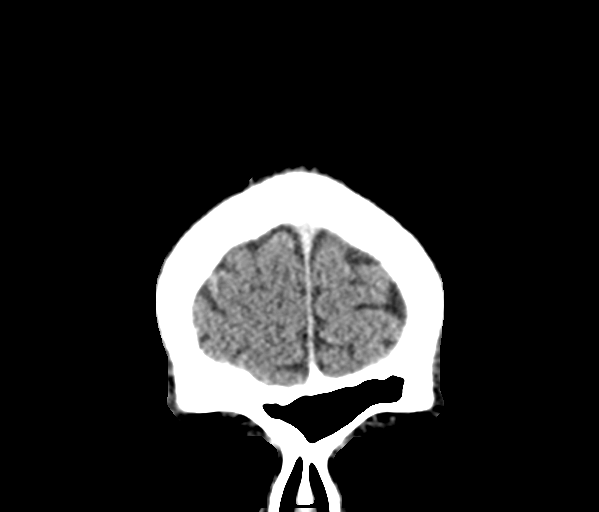

[Series 5: sagittal soft tissue · sagittal · 0.31mm/px · 3 of 57 slices shown]
[im 26/57  brain]
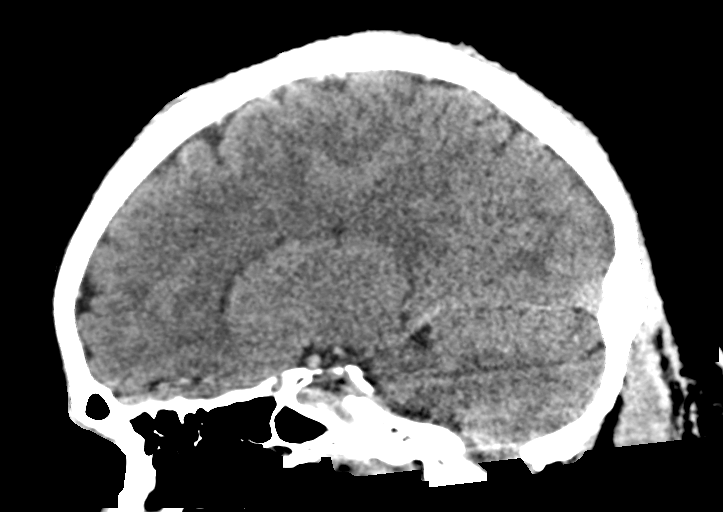
[im 29/57  brain]
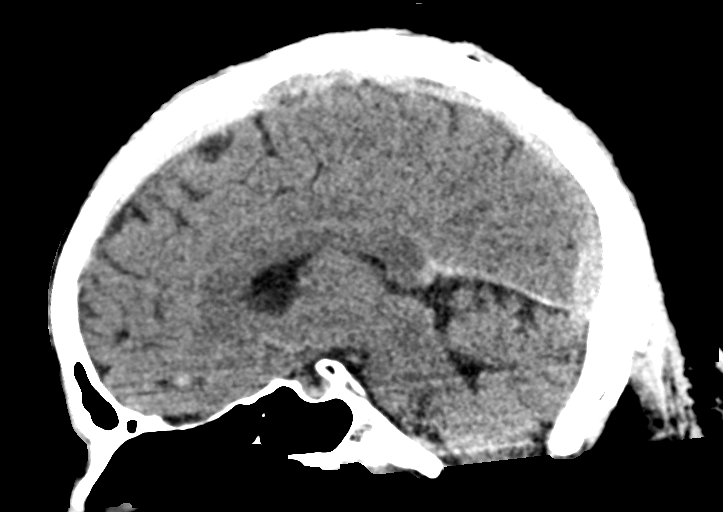
[im 31/57  brain]
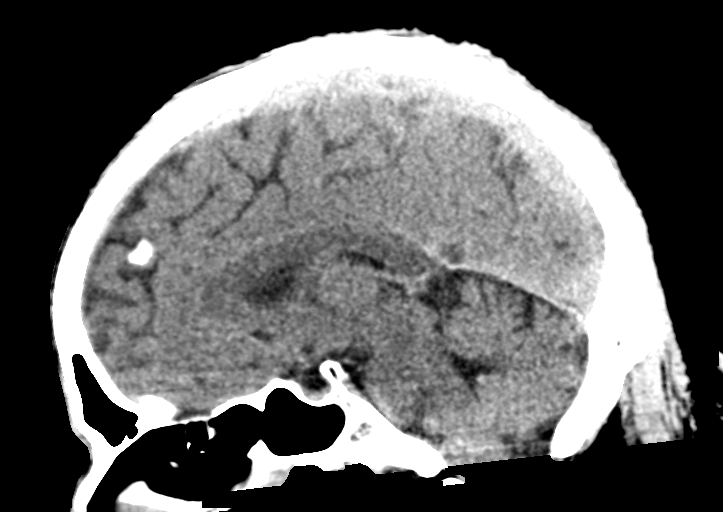

[13 of 41 positions shown; findings below may reference images not displayed]

FINDINGS: Brain: Normal ventricular morphology. No midline shift or mass
effect. Normal appearance of brain parenchyma. No intracranial
hemorrhage, mass lesion, evidence of acute infarction, or
extra-axial fluid collection.

Vascular: No hyperdense vessels

Skull: Intact

Sinuses/Orbits: Clear

Other: N/A
IMPRESSION: Normal exam, unchanged.

## 2022-03-05 ENCOUNTER — Other Ambulatory Visit: Payer: Self-pay | Admitting: Family Medicine

## 2022-03-23 ENCOUNTER — Other Ambulatory Visit: Payer: Self-pay | Admitting: Family Medicine

## 2022-03-23 DIAGNOSIS — R3121 Asymptomatic microscopic hematuria: Secondary | ICD-10-CM

## 2022-03-29 ENCOUNTER — Ambulatory Visit
Admission: RE | Admit: 2022-03-29 | Discharge: 2022-03-29 | Disposition: A | Discharge status: Home / Self Care | Payer: BC Managed Care – PPO | Source: Ambulatory Visit | Attending: Family Medicine | Admitting: Family Medicine

## 2022-03-29 DIAGNOSIS — R3121 Asymptomatic microscopic hematuria: Secondary | ICD-10-CM

## 2022-03-29 MED ORDER — IOPAMIDOL (ISOVUE-300) INJECTION 61%
100.0000 mL | Freq: Once | INTRAVENOUS | Status: AC | PRN
  Administered 2022-03-29: 100 mL via INTRAVENOUS

## 2022-07-04 ENCOUNTER — Other Ambulatory Visit: Payer: Self-pay

## 2022-07-04 DIAGNOSIS — Z125 Encounter for screening for malignant neoplasm of prostate: Secondary | ICD-10-CM

## 2022-07-09 ENCOUNTER — Other Ambulatory Visit: Payer: Self-pay | Admitting: *Deleted

## 2022-07-09 DIAGNOSIS — Z125 Encounter for screening for malignant neoplasm of prostate: Secondary | ICD-10-CM

## 2022-07-10 LAB — SPECIMEN STATUS REPORT

## 2022-07-10 LAB — PSA: Prostate Specific Ag, Serum: 0.7 ng/mL (ref 0.0–4.0)

## 2022-07-11 ENCOUNTER — Telehealth: Payer: Self-pay

## 2022-07-11 NOTE — Telephone Encounter (Signed)
Patient informed normal PSA results, 0.7, repeat in 1 year, verbalized understanding.

## 2022-07-12 NOTE — Progress Notes (Signed)
Patient: Jeremy Craig           Date of Birth: [birth year]           MRN: [id number] Visit Date: 07/09/2022 PCP: Patient, No Pcp Per  Prostate Cancer Screening Date of last physical exam:  (3 months ago) Date of last rectal exam:  (Unknown) Have you ever had any of the following?: None Have you ever had or been told you have an allergy to latex products?: No Are you currently taking any natural prostate preparations?: No Are you currently experiencing any urinary symptoms?: No  Prostate Exam Exam not completed. PSA Blood Test only completed.  Patient's History There are no problems to display for this patient.  Past Medical History:  Diagnosis Date   Hypertension     No family history on file.  Social History   Occupational History   Not on file  Tobacco Use   Smoking status: Never   Smokeless tobacco: Never  Vaping Use   Vaping Use: Never used  Substance and Sexual Activity   Alcohol use: Never   Drug use: Never   Sexual activity: Not on file

## 2022-07-14 ENCOUNTER — Emergency Department
Admission: EM | Admit: 2022-07-14 | Discharge: 2022-07-14 | Disposition: A | Discharge status: Home / Self Care | Payer: BC Managed Care – PPO | Attending: Emergency Medicine | Admitting: Emergency Medicine

## 2022-07-14 ENCOUNTER — Emergency Department: Payer: BC Managed Care – PPO

## 2022-07-14 ENCOUNTER — Other Ambulatory Visit: Payer: Self-pay

## 2022-07-14 DIAGNOSIS — S0990XA Unspecified injury of head, initial encounter: Secondary | ICD-10-CM

## 2022-07-14 DIAGNOSIS — I10 Essential (primary) hypertension: Secondary | ICD-10-CM | POA: Insufficient documentation

## 2022-07-14 DIAGNOSIS — W208XXA Other cause of strike by thrown, projected or falling object, initial encounter: Secondary | ICD-10-CM | POA: Insufficient documentation

## 2022-07-14 DIAGNOSIS — S060X0A Concussion without loss of consciousness, initial encounter: Secondary | ICD-10-CM | POA: Diagnosis not present

## 2022-07-14 DIAGNOSIS — Y92512 Supermarket, store or market as the place of occurrence of the external cause: Secondary | ICD-10-CM | POA: Diagnosis not present

## 2022-07-14 MED ORDER — OXYCODONE-ACETAMINOPHEN 5-325 MG PO TABS
1.0000 | ORAL_TABLET | Freq: Once | ORAL | Status: AC
  Administered 2022-07-14: 1 via ORAL
  Filled 2022-07-14: qty 1

## 2022-07-14 MED ORDER — OXYCODONE-ACETAMINOPHEN 5-325 MG PO TABS: 1.0000 | ORAL_TABLET | Freq: Four times a day (QID) | ORAL | 0 refills | Status: AC | PRN

## 2022-07-14 NOTE — ED Notes (Signed)
Pt was in [place] today and a box from shelf fell on his head. He felt dizzy after this and fell to ground but denies LOC. Denies vision changes at this time. Has 5/10 frontal HA. Repeat VS obtained.

## 2022-07-14 NOTE — ED Provider Notes (Signed)
[facility] Provider Note    Event Date/Time   First MD Initiated Contact with Patient 07/14/22 1738     (approximate)   History   Head Injury   HPI  Jeremy Craig is a 56 y.o. male reports a history of hypertension no other medical problems  Today he was shopping at [employer], and a box with 2 relatively small clocks fell off the shelf footer to above his head and landed right in the middle of his head.  He reports that stunned him and surprised him.  He felt briefly stunned, had to drop to 1 knee but did not fall or become injured other than the fact that it landed directly on his head.  When it first happened and for a while after he felt like his ears were ringing, he felt a little "stunned".  Otherwise he feels well.  Denies neck pain.  He did not pass out.  He got in his car and was actually going to drive to the ER when he decided to stop and call EMS because of the ringing in his ears and the stunned feeling he did not wish to drive at that time.  He is feeling better now but still has a mild headache.  The medication he was given at triage helped quite a bit       Physical Exam   Triage Vital Signs: ED Triage Vitals  Enc Vitals Group     BP 07/14/22 1352 (!) 156/96     Pulse Rate 07/14/22 1352 (!) 59     Resp 07/14/22 1352 20     Temp 07/14/22 1352 98.1 F (36.7 C)     Temp Source 07/14/22 1352 Oral     SpO2 07/14/22 1352 96 %     Weight 07/14/22 1349 243 lb (110.2 kg)     Height 07/14/22 1349 5\' 10"  (1.778 m)     Head Circumference --      Peak Flow --      Pain Score 07/14/22 1349 8     Pain Loc --      Pain Edu? --      Excl. in GC? --     Most recent vital signs: Vitals:   07/14/22 1352 07/14/22 1754  BP: (!) 156/96 (!) 147/90  Pulse: (!) 59 76  Resp: 20 16  Temp: 98.1 F (36.7 C)   SpO2: 96% 96%     General: Awake, no distress.  Normocephalic atraumatic except for maybe some very slight edema overlying  the vertex of the skull. No cervical tenderness.  Full range of motion of neck without pain. CV:  Good peripheral perfusion.  Normal heart tones Resp:  Normal effort.  Abd:  No distention.  Other:  Able to ambulate without difficulty.  Alert fully oriented.  No neurologic deficits to noted.  Well oriented and pleasant.   ED Results / Procedures / Treatments   Labs (all labs ordered are listed, but only abnormal results are displayed) Labs Reviewed - No data to display   EKG     RADIOLOGY  I personally interpreted the patient's CT head for acute gross pathology, it is negative for acute hemorrhage.  I also reviewed his CT cervical spine and find it interesting that he has a fused first and second vertebrae as the radiologist and noted  CT Cervical Spine Wo Contrast  Result Date: 07/14/2022 CLINICAL DATA:  56 year old male with neck pain following injury today.  Initial encounter. EXAM: CT CERVICAL SPINE WITHOUT CONTRAST TECHNIQUE: Multidetector CT imaging of the cervical spine was performed without intravenous contrast. Multiplanar CT image reconstructions were also generated. RADIATION DOSE REDUCTION: This exam was performed according to the departmental dose-optimization program which includes automated exposure control, adjustment of the mA and/or kV according to patient size and/or use of iterative reconstruction technique. COMPARISON:  None Available. FINDINGS: Alignment: Normal. Skull base and vertebrae: No acute fracture or suspicious bony lesion. Soft tissues and spinal canal: No prevertebral fluid or swelling. No visible canal hematoma. 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 Mild multilevel degenerative disc disease/spondylosis and LEFT facet arthropathy contributes to mild bony foraminal narrowing bilaterally at C2-3 and on the LEFT at C3-4. Upper chest: No acute abnormality Other: None IMPRESSION: 1. No evidence of acute injury to the cervical  spine. 2. 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 3. Mild multilevel degenerative changes contributing to mild bony foraminal narrowing bilaterally at C2-3 and on the LEFT at C3-4. Electronically Signed   By: Harmon Pier M.D.   On: 07/14/2022 17:15   CT Head Wo Contrast  Result Date: 07/14/2022 CLINICAL DATA:  Trauma EXAM: CT HEAD WITHOUT CONTRAST TECHNIQUE: Contiguous axial images were obtained from the base of the skull through the vertex without intravenous contrast. RADIATION DOSE REDUCTION: This exam was performed according to the departmental dose-optimization program which includes automated exposure control, adjustment of the mA and/or kV according to patient size and/or use of iterative reconstruction technique. COMPARISON:  12/15/2019 FINDINGS: Brain: No acute intracranial findings are seen. There are no signs of bleeding within the cranium. Ventricles are not dilated. Vascular: Unremarkable. Skull: No fracture is seen in calvarium. Sinuses/Orbits: Unremarkable. Other: None. IMPRESSION: No acute intracranial findings are seen in noncontrast CT brain. Electronically Signed   By: Ernie Avena M.D.   On: 07/14/2022 14:54    Patient denies any previous history of any neck issues or neck injuries.  Additionally at this time he reports does not have any neck pain or neck discomfort.  I suspect the fused vertebrae this may be some sort of a congenital abnormality based on his clinical history   PROCEDURES:  Critical Care performed: No  Procedures   MEDICATIONS ORDERED IN ED: Medications  oxyCODONE-acetaminophen (PERCOCET/ROXICET) 5-325 MG per tablet 1 tablet (1 tablet Oral Given by Other 07/14/22 1742)     IMPRESSION / MDM / ASSESSMENT AND PLAN / ED COURSE  I reviewed the triage vital signs and the nursing notes.                              Differential diagnosis includes, but is not limited to, minor head injury but needs evaluation and work-up for rule out  intracranial hemorrhage or evidence of serious traumatic head injury.    Patient's presentation is most consistent with acute presentation with potential threat to life or bodily function.  He is feeling improved after taking a Percocet and reports that still slight feeling of ringing in his ears but is much better than when the accident suddenly occurred.  He is alert well oriented reassuring neurologic and clinical exam.    Based on the patient's improved symptoms, reassuring imaging studies, it appears appropriate for him to have ongoing outpatient follow-up with his primary care as needed and treatment for per concussion with minor head injury.  He does not engage in  any contact activities or sports, he does not have a high strenuous job and actually wishes to go to work tomorrow.  He is agreeable to not driving or working in dangerous areas while taking oxycodone.    I will prescribe the patient a narcotic pain medicine due to their condition which I anticipate will cause at least moderate pain short term. I discussed with the patient safe use of narcotic pain medicines, and that they are not to drive, work in dangerous areas, or ever take more than prescribed (no more than 1 pill every 6 hours). We discussed that this is the type of medication that can be  overdosed on and the risks of this type of medicine. Patient is very agreeable to only use as prescribed and to never use more than prescribed.   FINAL CLINICAL IMPRESSION(S) / ED DIAGNOSES   Final diagnoses:  Minor head injury, initial encounter  Concussion without loss of consciousness, initial encounter     Rx / DC Orders   ED Discharge Orders          Ordered    oxyCODONE-acetaminophen (PERCOCET/ROXICET) 5-325 MG tablet  Every 6 hours PRN        07/14/22 1847             Note:  This document was prepared using Dragon voice recognition software and may include unintentional dictation errors.   Sharyn Creamer,  MD 07/14/22 [phone]

## 2022-07-14 NOTE — ED Triage Notes (Signed)
Pt in via EMS from a store. EMS reports while in the store a box fell on his head. Pt with c/o pain to head and ears. Pt reports the box had clocks in it. No LOC. Pt also reports dizziness.

## 2022-07-14 NOTE — ED Triage Notes (Signed)
Pt reports was at [facility] and a box full of clocks fell off a shelf and onto his head. Pt reports now feels dizzy and has a headache and ringing in his ears. Denies LOC

## 2022-07-14 NOTE — Discharge Instructions (Addendum)
No driving with an 8 hours of use of oxycodone.  You were seen in the Emergency Department (ED) today for a head injury.  Based on your evaluation, you may have sustained a concussion (or bruise) to your brain.    Symptoms to expect from a concussion include nausea, mild to moderate headache, difficulty concentrating or sleeping, and mild lightheadedness.  These symptoms should improve over the next few days to weeks, but it may take many weeks before you feel back to normal.  Return to the emergency department or follow-up with your primary care doctor if your symptoms are not improving over this time.  Signs of a more serious head injury include vomiting, severe headache, excessive sleepiness or confusion, and weakness or numbness in your face, arms or legs.  Return immediately to the Emergency Department if you experience any of these more concerning symptoms.    Rest, avoid strenuous physical or mental activity, and avoid activities that could potentially result in another head injury until all your symptoms from this head injury are completely resolved for at least 2-3 weeks.  If you participate in sports, get cleared by your doctor or trainer before returning to play.  You may take ibuprofen or acetaminophen over the counter according to label instructions for mild headache or scalp soreness.

## 2022-07-16 NOTE — ED Notes (Signed)
Pt returns to ED today to request an extended work note, Dr. Fanny Bien here and states this is okay and pt can go back to work on 11/21.

## 2022-08-09 ENCOUNTER — Encounter: Payer: Self-pay | Admitting: Family Medicine

## 2022-08-09 ENCOUNTER — Other Ambulatory Visit: Payer: Self-pay | Admitting: Family Medicine

## 2022-08-09 DIAGNOSIS — M5412 Radiculopathy, cervical region: Secondary | ICD-10-CM

## 2022-08-09 DIAGNOSIS — R519 Headache, unspecified: Secondary | ICD-10-CM

## 2022-08-29 ENCOUNTER — Ambulatory Visit
Admission: RE | Admit: 2022-08-29 | Discharge: 2022-08-29 | Disposition: A | Discharge status: Home / Self Care | Payer: BC Managed Care – PPO | Source: Ambulatory Visit | Attending: Family Medicine | Admitting: Family Medicine

## 2022-08-29 DIAGNOSIS — M5412 Radiculopathy, cervical region: Secondary | ICD-10-CM

## 2022-08-29 DIAGNOSIS — R519 Headache, unspecified: Secondary | ICD-10-CM

## 2022-09-06 ENCOUNTER — Other Ambulatory Visit: Payer: BC Managed Care – PPO

## 2023-07-23 ENCOUNTER — Other Ambulatory Visit: Payer: Self-pay

## 2023-07-23 DIAGNOSIS — Z125 Encounter for screening for malignant neoplasm of prostate: Secondary | ICD-10-CM

## 2023-07-24 ENCOUNTER — Other Ambulatory Visit: Payer: Self-pay | Admitting: *Deleted

## 2023-07-24 DIAGNOSIS — Z125 Encounter for screening for malignant neoplasm of prostate: Secondary | ICD-10-CM

## 2023-07-24 NOTE — Progress Notes (Signed)
Patient: Jeremy Craig           Date of Birth: 1966/04/06           MRN: [id number] Visit Date: 07/24/2023 PCP: Patient, No Pcp Per  Prostate Cancer Screening Date of last physical exam:  (June) Date of last rectal exam:  (2 years ago) Have you ever had any of the following?: None Have you ever had or been told you have an allergy to latex products?: No Are you currently taking any natural prostate preparations?: No Are you currently experiencing any urinary symptoms?: No  Prostate Exam Exam not completed. PSA blood test only completed.   Patient's History There are no problems to display for this patient.  Past Medical History:  Diagnosis Date   Hypertension     No family history on file.  Social History   Occupational History   Not on file  Tobacco Use   Smoking status: Never   Smokeless tobacco: Never  Vaping Use   Vaping status: Never Used  Substance and Sexual Activity   Alcohol use: Never   Drug use: Never   Sexual activity: Not on file

## 2023-07-25 LAB — PSA: Prostate Specific Ag, Serum: 0.5 ng/mL (ref 0.0–4.0)
